# Patient Record
Sex: Male | Born: 1975 | Hispanic: Yes | Marital: Married
Health system: Midwestern US, Community
[De-identification: ages and names within clinical notes are randomized; demographics above are authoritative.]

---

## 2020-11-17 NOTE — Telephone Encounter (Signed)
-----   Message from Ernest Mallick, [place] sent at 11/17/2020  1:22 PM EST -----  Patient on the automated phone system asked for tomorrow's appointment to be rescheduled.

## 2020-11-17 NOTE — Telephone Encounter (Signed)
Spoke with patient, requesting in March appointment. Rescheduled for 01/13/21 @ 1 pm with Dr. Kathryne Gin. Patient verbalized understanding of appointment date/time.

## 2020-11-18 ENCOUNTER — Encounter: Attending: Colon & Rectal Surgery

## 2021-01-13 ENCOUNTER — Ambulatory Visit: Admit: 2021-01-13 | Discharge: 2021-01-13 | Payer: BLUE CROSS/BLUE SHIELD | Attending: Colon & Rectal Surgery

## 2021-01-13 ENCOUNTER — Encounter

## 2021-01-13 DIAGNOSIS — K5792 Diverticulitis of intestine, part unspecified, without perforation or abscess without bleeding: Secondary | ICD-10-CM

## 2021-01-13 NOTE — Progress Notes (Signed)
Assessment:      Diagnosis Orders   1. Diverticulitis     2. Colostomy status (HCC)     3. Peristomal hernia                 Plan:     1.  Obtain records from [place], [place]  2.  Transstomal and transanal colonoscopy to assess prior to takedown  3.  Office visit after the above for surgical planning.  The peristomal hernia is large and will require an open approach to repair.       Objective   History:     Calvin Hanson is a 45 y.o. male who presents today for assessment for colostomy takedown.  He has what appears to be a sigmoid colostomy and is s/p emergency open surgery in [place] in December 2020.  By his report this was for diverticulitis and included colon resection, small bowel resection.  He states that this was not for cancer but there was a "tumor."  Records will be obtained.  He has no history of IBD and no family history of colorectal cancer.  He reports no antecedent incontinence.  The patient is seen in the office today with Dory Peru CMA.         Physical Exam:   BP 124/78 (Site: Right Upper Arm, Position: Sitting, Cuff Size: Large Adult)    Pulse 84    Temp 98.5 ??F (36.9 ??C)    Ht 5\' 5"  (1.651 m)    Wt (!) 301 lb 6.4 oz (136.7 kg)    BMI 50.16 kg/m??     Physical Exam  Constitutional:       General: He is not in acute distress.     Appearance: He is well-developed. He is not ill-appearing, toxic-appearing or diaphoretic.   HENT:      Head: Normocephalic and atraumatic.      Nose: Nose normal.   Eyes:      General: No scleral icterus.        Right eye: No discharge.         Left eye: No discharge.      Conjunctiva/sclera: Conjunctivae normal.   Neck:      Thyroid: No thyromegaly.      Vascular: No carotid bruit or JVD.      Trachea: No tracheal deviation.   Cardiovascular:      Rate and Rhythm: Normal rate and regular rhythm.      Heart sounds: Normal heart sounds, S1 normal and S2 normal. No murmur heard.  No friction rub. No gallop.    Pulmonary:      Effort: Pulmonary effort  is normal. No accessory muscle usage or respiratory distress.      Breath sounds: Normal breath sounds. No stridor. No wheezing, rhonchi or rales.   Abdominal:      General: Bowel sounds are normal. There is no distension.      Palpations: Abdomen is soft. There is no mass.      Tenderness: There is no abdominal tenderness. There is no guarding or rebound.      Hernia: A hernia is present.      Comments: Stoma viable.  Large peristomal hernia, reducible.   Musculoskeletal:         General: No tenderness. Normal range of motion.      Cervical back: Neck supple.   Skin:     General: Skin is warm and dry.      Coloration: Skin is  not pale.      Findings: No erythema or rash.   Neurological:      Mental Status: He is alert and oriented to person, place, and time.      Coordination: Coordination normal.   Psychiatric:         Mood and Affect: Mood normal.         Behavior: Behavior normal.         Thought Content: Thought content normal.         Judgment: Judgment normal.           ROS: Review of Systems as recordedby the medical assistant has been reviewed by me, and I agree except as noted inthe HPI.    Past Medical History:   Diagnosis Date   ??? Diabetes (HCC)    ??? Diverticulitis    ??? Hyperlipidemia        Past Surgical History:   Procedure Laterality Date   ??? APPENDECTOMY     ??? COLOSTOMY     ??? TUMOR REMOVAL     ??? UMBILICAL HERNIA REPAIR         Current Outpatient Medications   Medication Sig Dispense Refill   ??? atorvastatin (LIPITOR) 20 MG tablet 1 tab(s)     ??? glimepiride (AMARYL) 2 MG tablet 2 tab(s)     ??? Glucose Blood (GLUCOMETER DEX GLUCOSE SENSORS VI) use as directed to check blood sugar once daily and as needed     ??? Lancets 28G MISC use as directed to check blood sugar once daily and as needed     ??? losartan (COZAAR) 100 MG tablet 1 tab(s)     ??? metFORMIN (GLUCOPHAGE XR) 500 MG extended release tablet 2 tabs     ??? Insulin NPH Isophane & Regular (NOVOLIN 70/30 SC) 8 units     ??? pantoprazole (PROTONIX) 40 MG  tablet 1 tab(s)     ??? Insulin Pen Needle (PEN NEEDLES 29GX1/2") 29G X MISC use as directed with insulin     ??? Albuterol Sulfate (PROAIR HFA IN) 2 puff(s)     ??? Urinary Tract Infection Test (AZO TEST STRIPS VI) use as directed to check blood sugar once daily and as needed       No current facility-administered medications for this visit.       Social History     Socioeconomic History   ??? Marital status: Married     Spouse name: Not on file   ??? Number of children: Not on file   ??? Years of education: Not on file   ??? Highest education level: Not on file   Occupational History   ??? Not on file   Tobacco Use   ??? Smoking status: Former Smoker   ??? Smokeless tobacco: Never Used   Substance and Sexual Activity   ??? Alcohol use: Yes   ??? Drug use: Yes   ??? Sexual activity: Not on file   Other Topics Concern   ??? Not on file   Social History Narrative   ??? Not on file     Social Determinants of Health     Financial Resource Strain:    ??? Difficulty of Paying Living Expenses: Not on file   Food Insecurity:    ??? Worried About Running Out of Food in the Last Year: Not on file   ??? Ran Out of Food in the Last Year: Not on file   Transportation Needs:    ??? Lack of  Transportation (Medical): Not on file   ??? Lack of Transportation (Non-Medical): Not on file   Physical Activity:    ??? Days of Exercise per Week: Not on file   ??? Minutes of Exercise per Session: Not on file   Stress:    ??? Feeling of Stress : Not on file   Social Connections:    ??? Frequency of Communication with Friends and Family: Not on file   ??? Frequency of Social Gatherings with Friends and Family: Not on file   ??? Attends Religious Services: Not on file   ??? Active Member of Clubs or Organizations: Not on file   ??? Attends Banker Meetings: Not on file   ??? Marital Status: Not on file   Intimate Partner Violence:    ??? Fear of Current or Ex-Partner: Not on file   ??? Emotionally Abused: Not on file   ??? Physically Abused: Not on file   ??? Sexually Abused: Not on file    Housing Stability:    ??? Unable to Pay for Housing in the Last Year: Not on file   ??? Number of Places Lived in the Last Year: Not on file   ??? Unstable Housing in the Last Year: Not on file       History reviewed. No pertinent family history.    Allergies:  Allergies   Allergen Reactions   ??? Aleve [Naproxen] Other (See Comments)   ??? Penicillin G Other (See Comments)       PCP: No primary care provider on file.    Electronically signedby Lindell Noe, MD on 01/13/2021 at 1:35 PM.

## 2021-01-24 NOTE — H&P (Signed)
Assessment:   ??  ?? Diagnosis Orders   1. Diverticulitis  ??   2. Colostomy status (HCC)  ??   3. Peristomal hernia  ??   ??  ??  ??  Plan:   ??  1.  Obtain records from [place], [place]  2.  Transstomal and transanal colonoscopy to assess prior to takedown  3.  Office visit after the above for surgical planning.  The peristomal hernia is large and will require an open approach to repair.  ??  Objective   History:   ??  Calvin Hanson is a 45 y.o. male who presents today for assessment for colostomy takedown.  He has what appears to be a sigmoid colostomy and is s/p emergency open surgery in [place] in December 2020.  By his report this was for diverticulitis and included colon resection, small bowel resection.  He states that this was not for cancer but there was a "tumor."  Records will be obtained.  He has no history of IBD and no family history of colorectal cancer.  He reports no antecedent incontinence.  The patient is seen in the office today with Dory Peru CMA.  ??  ??  Physical Exam:   BP 124/78 (Site: Right Upper Arm, Position: Sitting, Cuff Size: Large Adult)    Pulse 84    Temp 98.5 ??F (36.9 ??C)    Ht 5\' 5"  (1.651 m)    Wt (!) 301 lb 6.4 oz (136.7 kg)    BMI 50.16 kg/m??   ??  Physical Exam  Constitutional:       General: He is not in acute distress.     Appearance: He is well-developed. He is not ill-appearing, toxic-appearing or diaphoretic.   HENT:      Head: Normocephalic and atraumatic.      Nose: Nose normal.   Eyes:      General: No scleral icterus.        Right eye: No discharge.         Left eye: No discharge.      Conjunctiva/sclera: Conjunctivae normal.   Neck:      Thyroid: No thyromegaly.      Vascular: No carotid bruit or JVD.      Trachea: No tracheal deviation.   Cardiovascular:      Rate and Rhythm: Normal rate and regular rhythm.      Heart sounds: Normal heart sounds, S1 normal and S2 normal. No murmur heard.  No friction rub. No gallop.    Pulmonary:      Effort: Pulmonary effort is  normal. No accessory muscle usage or respiratory distress.      Breath sounds: Normal breath sounds. No stridor. No wheezing, rhonchi or rales.   Abdominal:      General: Bowel sounds are normal. There is no distension.      Palpations: Abdomen is soft. There is no mass.      Tenderness: There is no abdominal tenderness. There is no guarding or rebound.      Hernia: A hernia is present.      Comments: Stoma viable.  Large peristomal hernia, reducible.   Musculoskeletal:         General: No tenderness. Normal range of motion.      Cervical back: Neck supple.   Skin:     General: Skin is warm and dry.      Coloration: Skin is not pale.      Findings: No erythema or rash.  Neurological:      Mental Status: He is alert and oriented to person, place, and time.      Coordination: Coordination normal.   Psychiatric:         Mood and Affect: Mood normal.         Behavior: Behavior normal.         Thought Content: Thought content normal.         Judgment: Judgment normal.   ??  ??

## 2021-02-03 ENCOUNTER — Ambulatory Visit

## 2021-02-05 ENCOUNTER — Inpatient Hospital Stay: Attending: Student in an Organized Health Care Education/Training Program

## 2021-02-09 ENCOUNTER — Inpatient Hospital Stay: Attending: Colon & Rectal Surgery

## 2021-02-24 ENCOUNTER — Encounter: Payer: BLUE CROSS/BLUE SHIELD | Attending: Colon & Rectal Surgery

## 2022-01-03 IMAGING — RF BARIUM ENEMA W KUB
5 series · 5 of 5 positions shown · non-contrast
Comparison: None.

HISTORY: Colostomy status
TECHNIQUE: After scout KUB abdomen x-ray study, limited barium enema exam through left lower abdomen colostomy performed. Foley catheter utilized. Fluoroscopic time is 0.5 minutes. 5 fluoroscopic images saved. 50% Gastrografin and single contrast of barium contrast utilized.

[Series 1: fluoro_barium 2fps_bw · 0.17mm/px · 1 of 1 slices shown (1 of 5)]
[im 1/1]
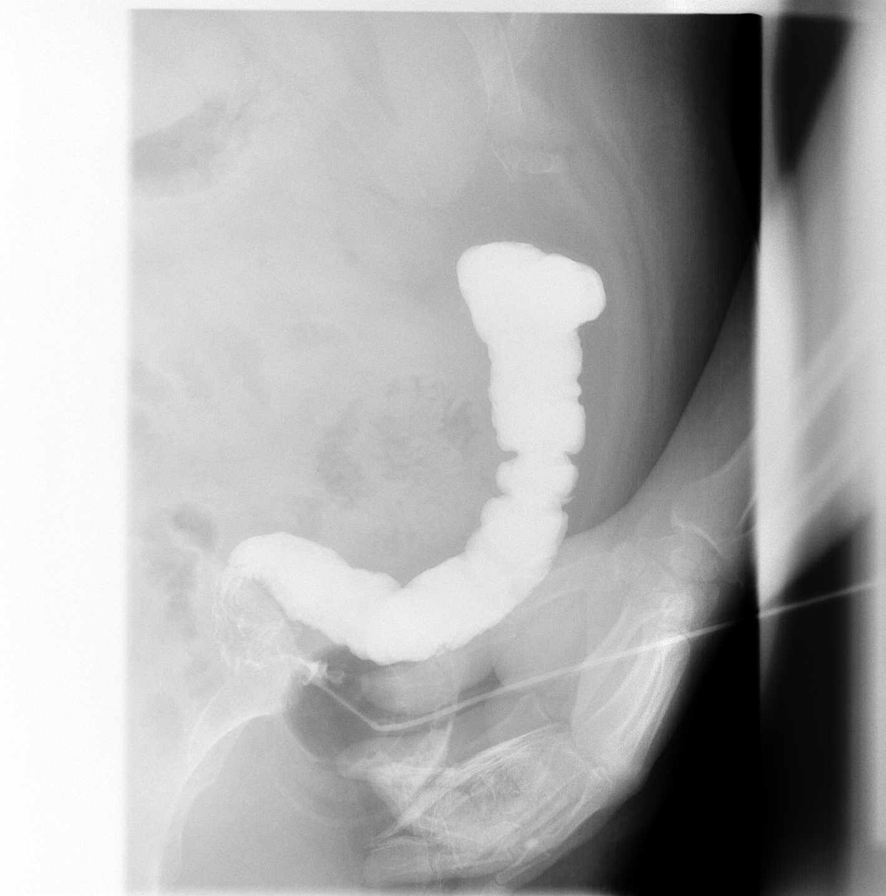

[Series 2: fluoro_barium 2fps_bw · 0.17mm/px · 1 of 1 slices shown (2 of 5)]
[im 1/1]
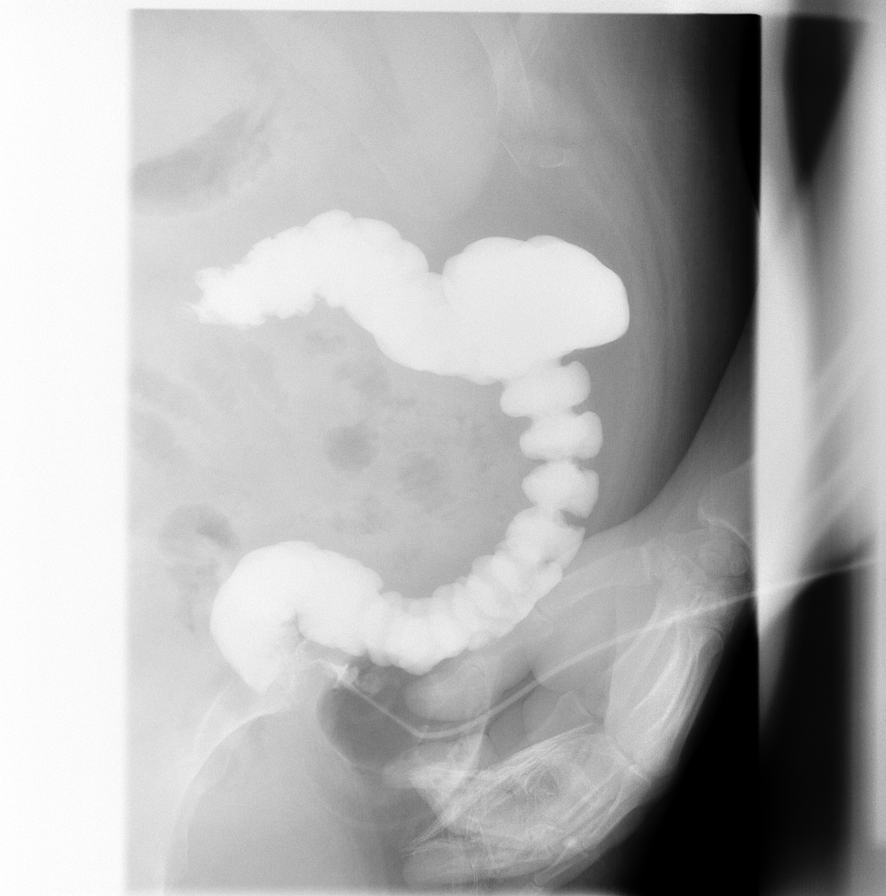

[Series 3: fluoro_barium 2fps_bw · 0.17mm/px · 1 of 1 slices shown (3 of 5)]
[im 1/1]
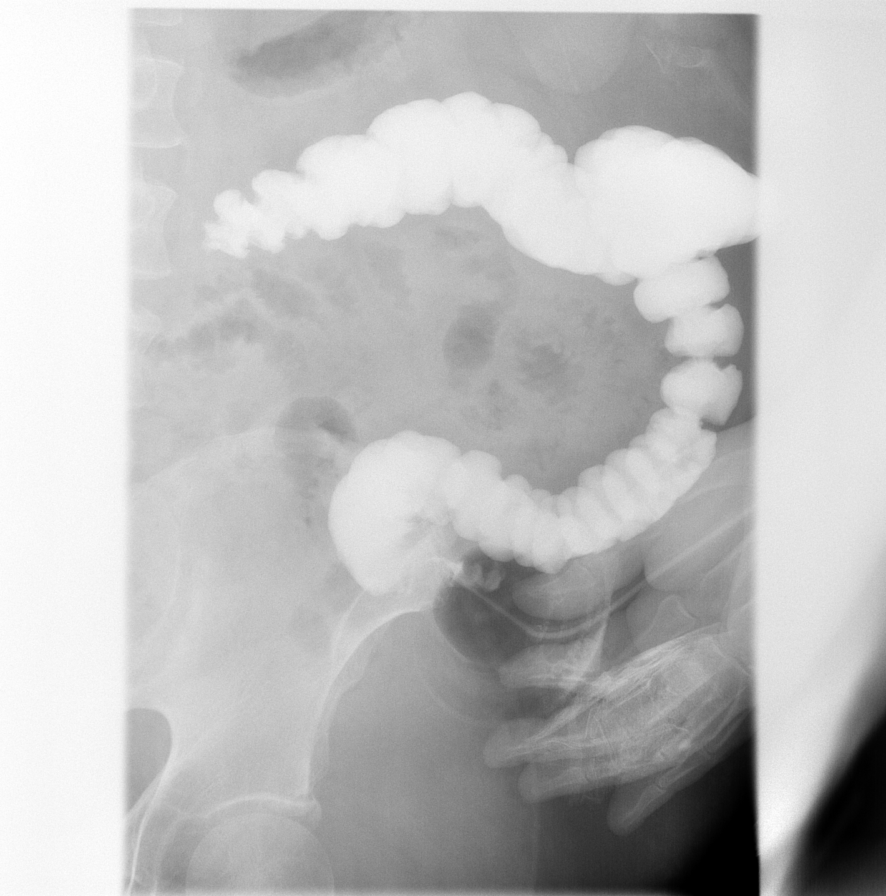

[Series 4: fluoro_barium 2fps_bw · 0.17mm/px · 1 of 1 slices shown (4 of 5)]
[im 1/1]
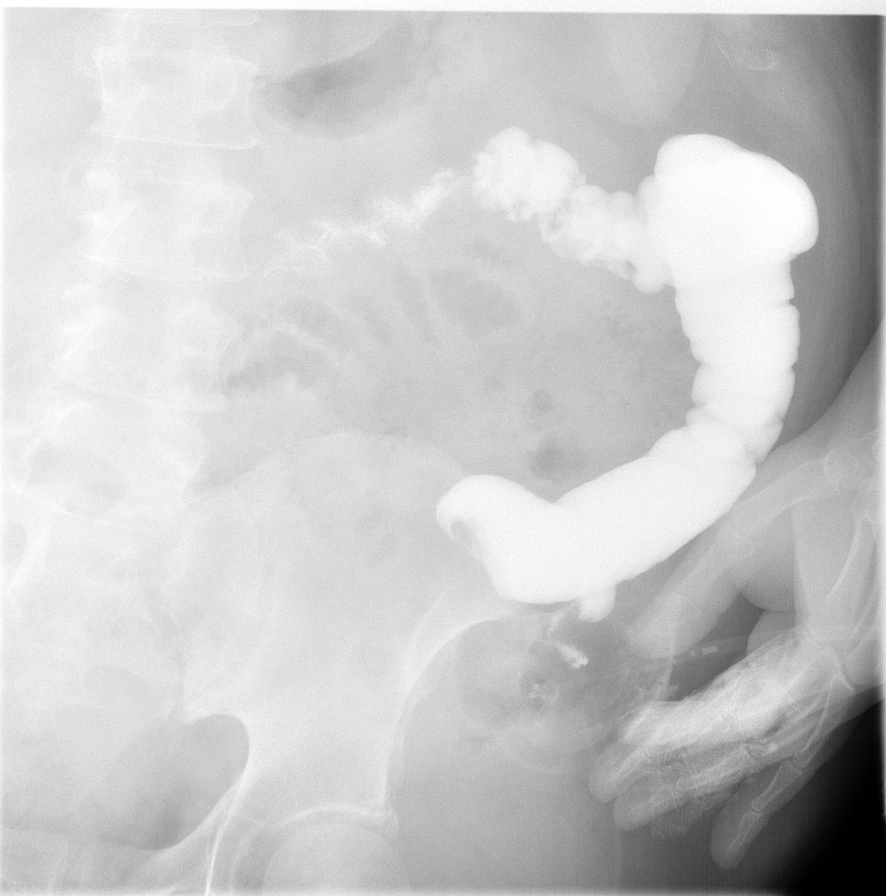

[Series 5: fluoro_barium 2fps_bw · 0.17mm/px · 1 of 1 slices shown (5 of 5)]
[im 1/1]
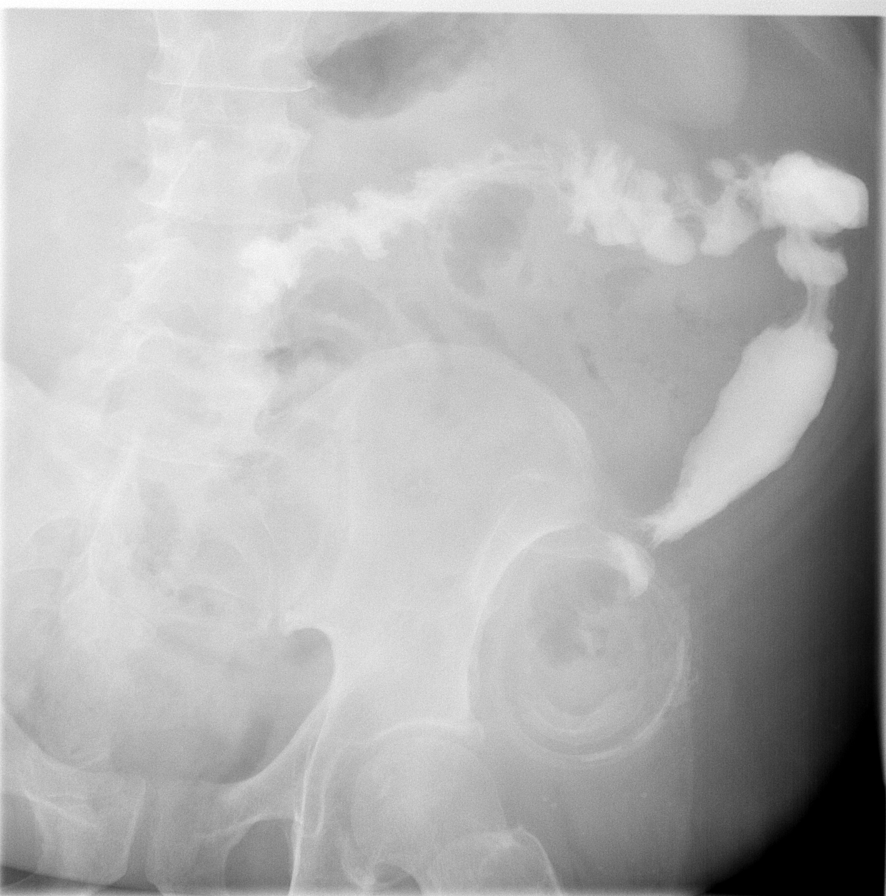

[5 of 5 positions shown; findings below may reference images not displayed]

FINDINGS: [facility] abdomen x-ray shows normal intestinal gas pattern. Curvature of lumbar spine to left with apex at L3 seen. No abnormal calcifications seen. Mild degenerative changes of SI joints, hip joints and pubic symphysis identified.

Visualized portions of descending colon and sigmoid colon proximal to the ostomy site is within normal limits. Visualized portions of middle to distal transverse colon is normal. No contrast extravasation is identified.
IMPRESSION: Limited barium enema study through the sigmoid colon colostomy shows no acute disease process.
